# Patient Record
Sex: Female | Born: 1990
Health system: Western US, Academic
[De-identification: ages and names within clinical notes are randomized; demographics above are authoritative.]

---

## 2022-04-08 IMAGING — MR COLUNA LOMBAR
4 series · 31 of 48 positions shown · non-contrast
Comparison: none

[Series 3001: T2 · sagittal · 4.0mm · 0.72mm/px · 8 of 12 slices shown]
[im 1/12]
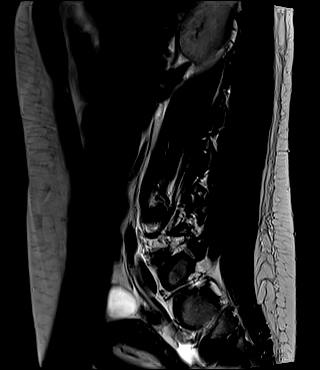
[im 2/12]
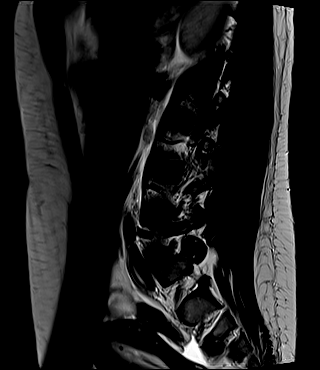
[im 4/12]
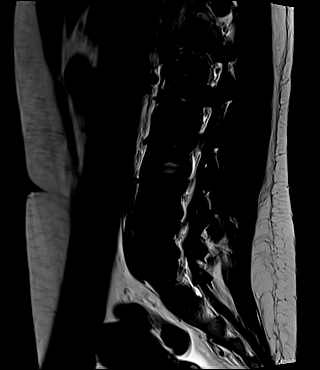
[im 5/12]
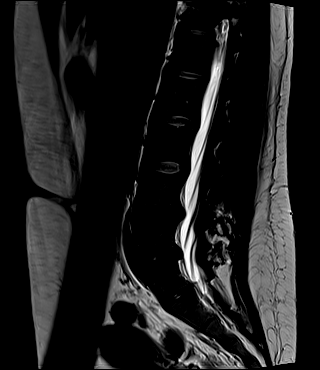
[im 7/12]
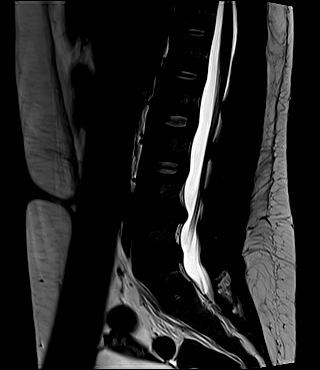
[im 8/12]
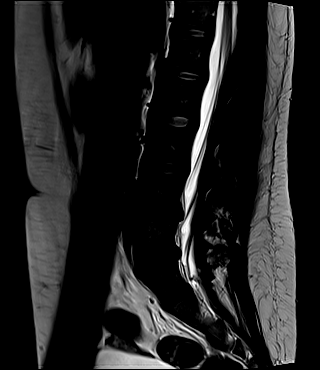
[im 10/12]
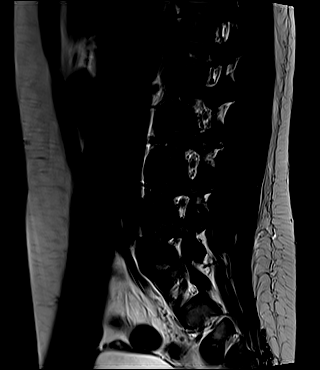
[im 12/12]
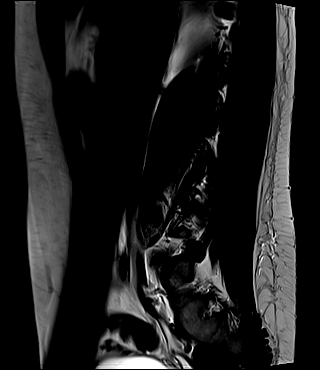

[Series 4001: T1 · sagittal · 4.0mm · 0.80mm/px · 9 of 12 slices shown]
[im 1/12]
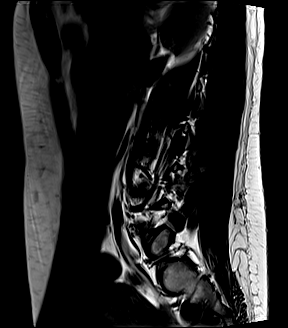
[im 2/12]
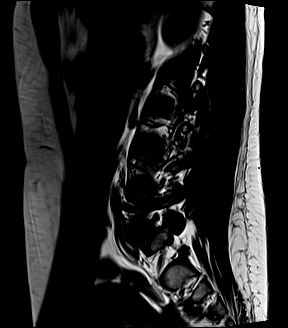
[im 3/12]
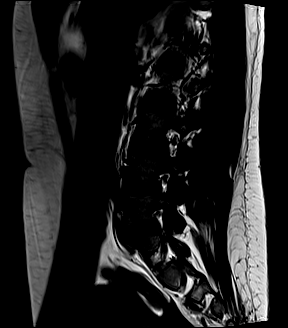
[im 5/12]
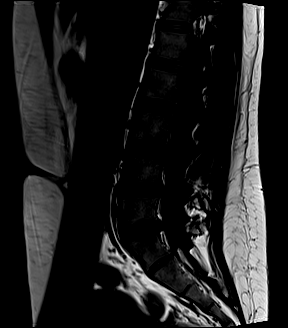
[im 6/12]
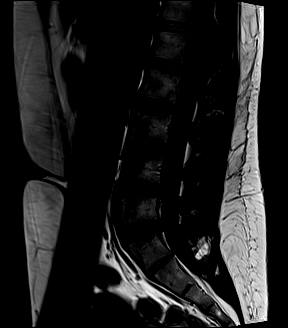
[im 7/12]
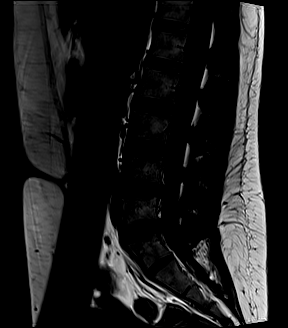
[im 9/12]
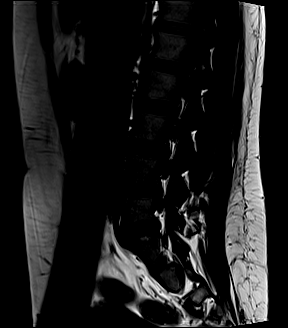
[im 10/12]
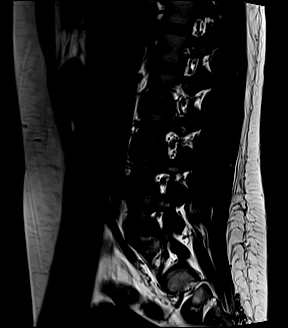
[im 12/12]
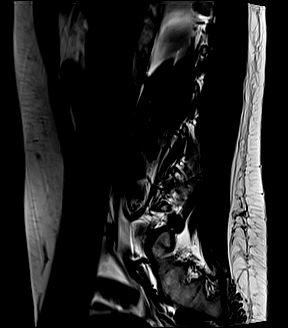

[Series 5001: sag_t2_qtse_stir · sagittal · 4.0mm · 0.55mm/px · 9 of 12 slices shown]
[im 1/12]
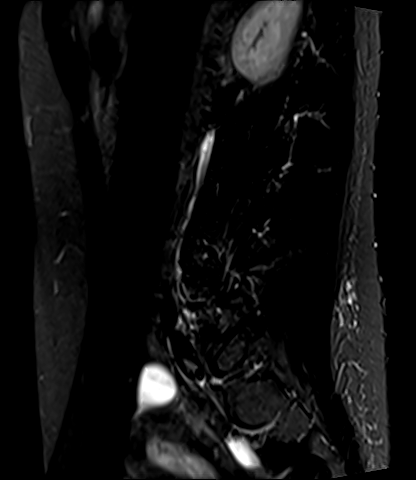
[im 2/12]
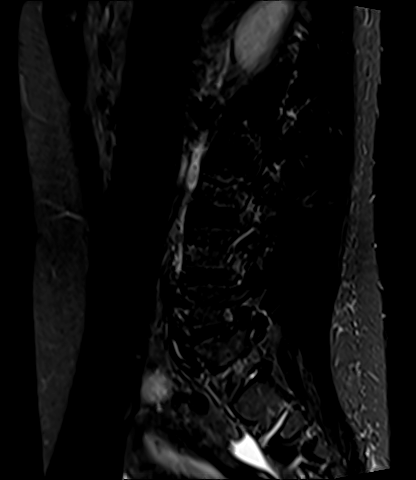
[im 3/12]
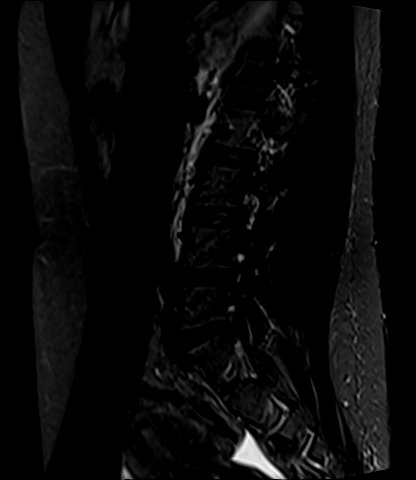
[im 5/12]
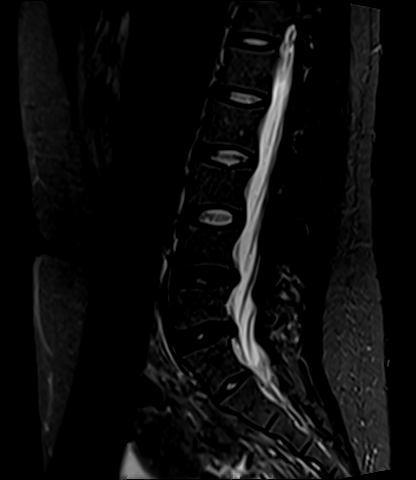
[im 6/12]
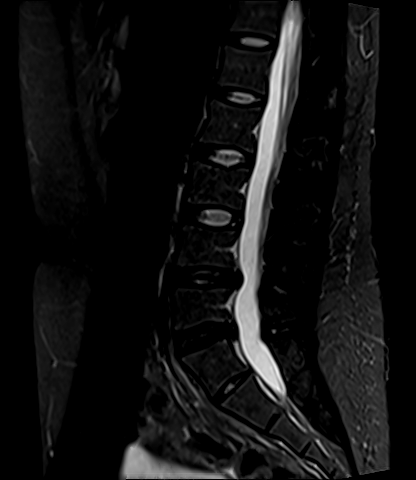
[im 7/12]
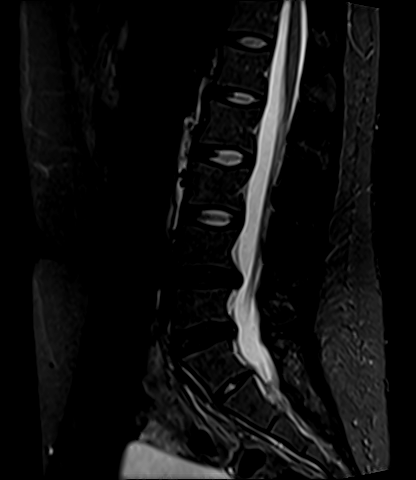
[im 9/12]
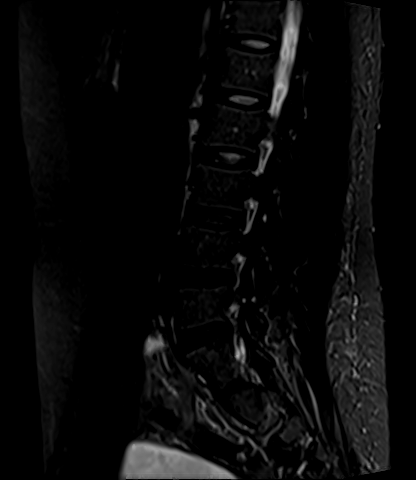
[im 10/12]
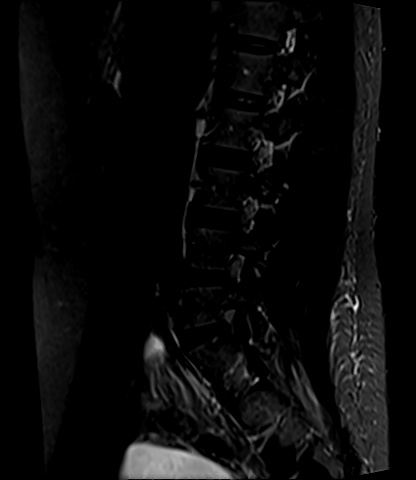
[im 12/12]
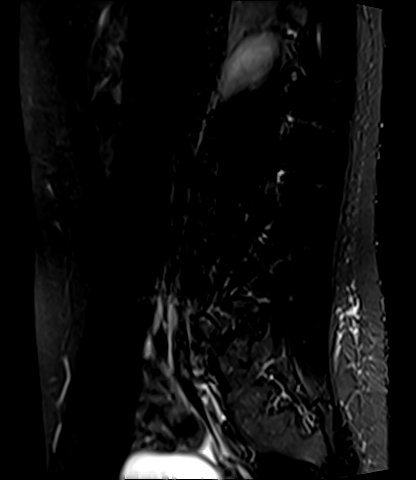

[Series 6001: axial_t2_qtse_384 · axial · 4.0mm · 0.43mm/px · z∈[-78,+92]mm · 5 of 25 slices shown]
[im 2/25]
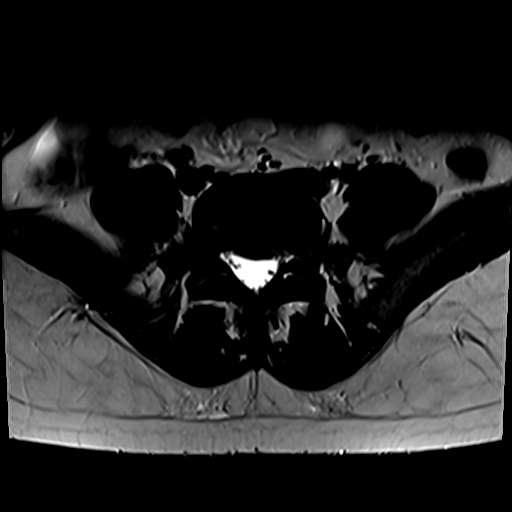
[im 4/25]
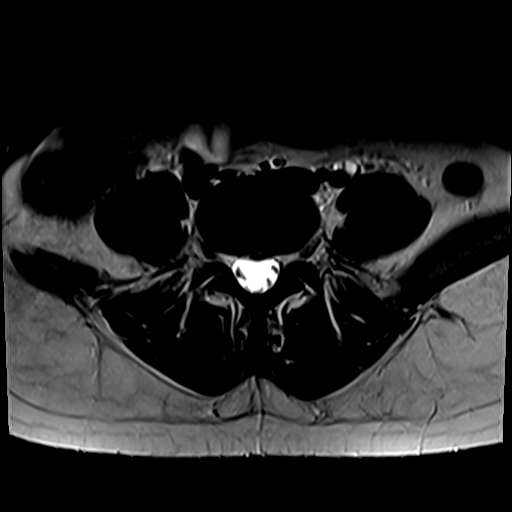
[im 8/25]
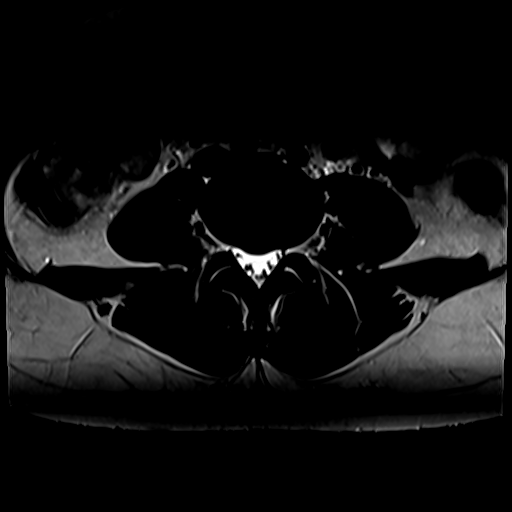
[im 13/25]
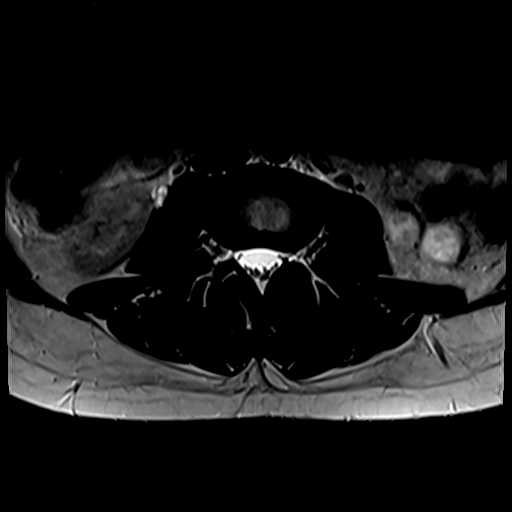
[im 21/25]
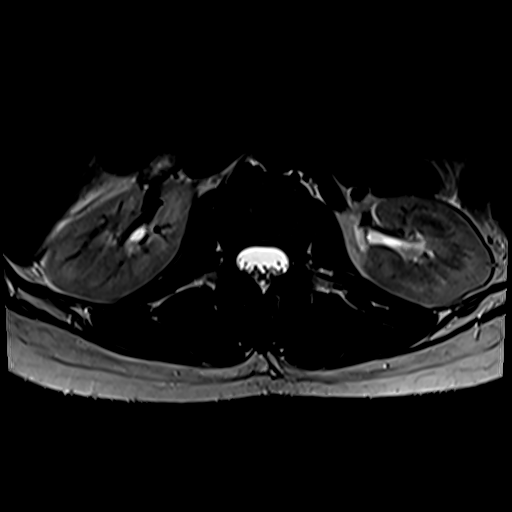

[31 of 48 positions shown; findings below may reference images not displayed]

Solicitante: WAHON AJAH

Técnica de exame:
Exame de ressonância magnética da coluna lombo-sacra realizado com as sequências FAST SPIN-ECHO,
incluindo ponderações T1 e T2 nos planos sagital e axial.
Indicação clínica: Lombociatalgia
RESSONÂNCIA MAGNÉTICA DA COLUNA LOMBOSSACRA
Os seguintes aspectos foram observados:
Estrutura óssea com sinal preservado.
Desidratação discal em L4-L5 e L5-S1.
Abaulamento discal difuso em L4-L5, com componente discal protruso póstero-central, comprimindo a face
ventral do saco dural e reduzindo a amplitude das bases foraminais.
Abaulamento discal difuso simétrico em L5-S1, comprimindo a face ventral do saco dural e reduzindo a
amplitude das bases foraminais.
Cone medular de configuração anatômica e homogeneidade de sinal.
Canal raquidiano com dimensões normais.
Articulações apofisárias preservadas.
Estruturas paravertebrais sem evidências de anormalidades.
Estruturas ligamentares sem anormalidades.
As raízes visibilizadas nos forames de conjugação são de aspecto normal.
Impressão Diagnóstica:
Alterações degenerativas da coluna lombar, cujas características foram acima descritas.
Solicitante: WAHON AJAH

## 2022-04-08 IMAGING — MR BACIA BACIA
4 of 7 series · 18 of 48 positions shown · non-contrast
Comparison: none

[Series 1001: loc · axial · B · 8.0mm · 0.78mm/px · z∈[-73,+152]mm · 4 of 15 slices shown]
[im 1/15]
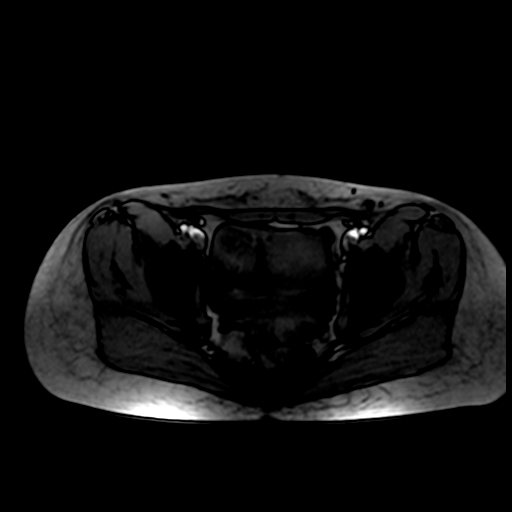
[im 5/15]
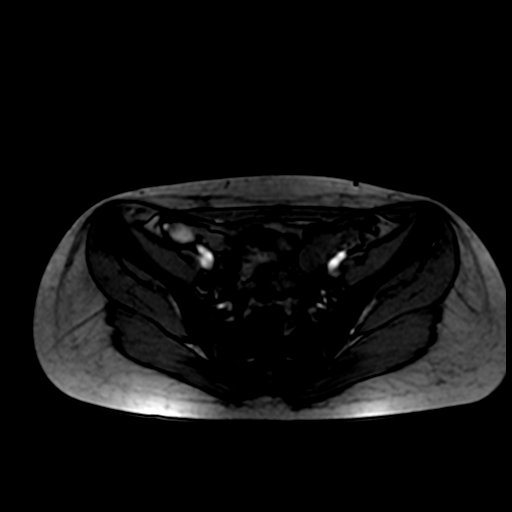
[im 10/15]
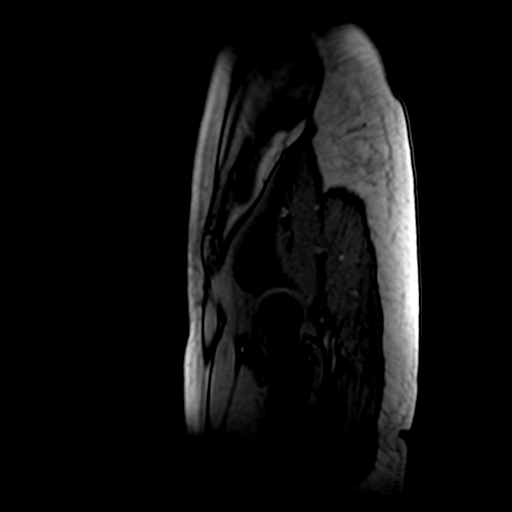
[im 15/15]
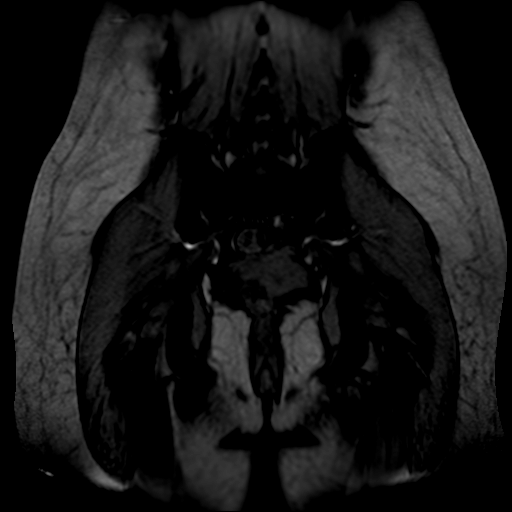

[Series 2001: t2_tse_stir_cor_p2 · coronal · B · 4.0mm · 1.41mm/px · 6 of 25 slices shown]
[im 1/25]
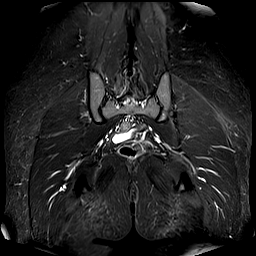
[im 5/25]
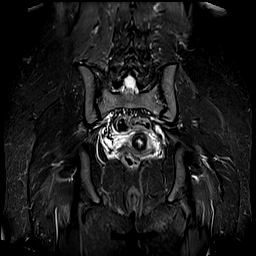
[im 10/25]
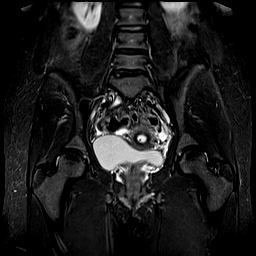
[im 15/25]
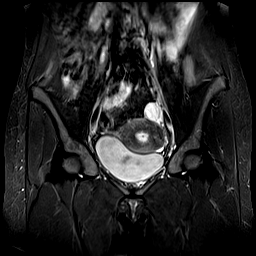
[im 20/25]
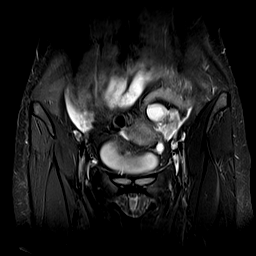
[im 25/25]
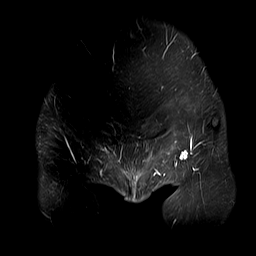

[Series 3001: T1 · coronal · B · 4.0mm · 0.43mm/px · 5 of 25 slices shown]
[im 1/25]
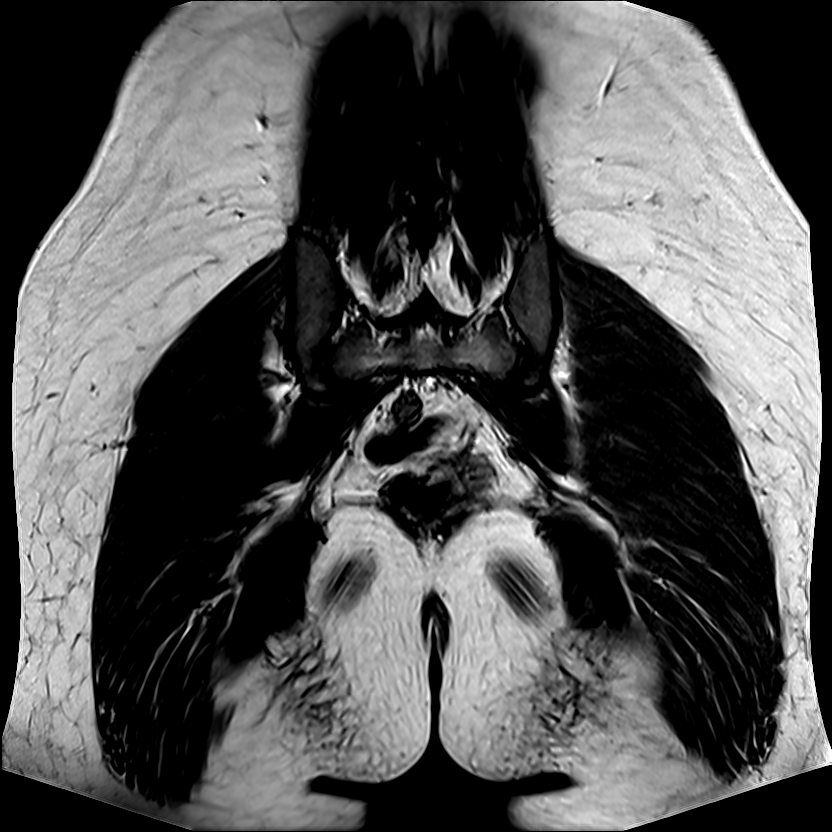
[im 5/25]
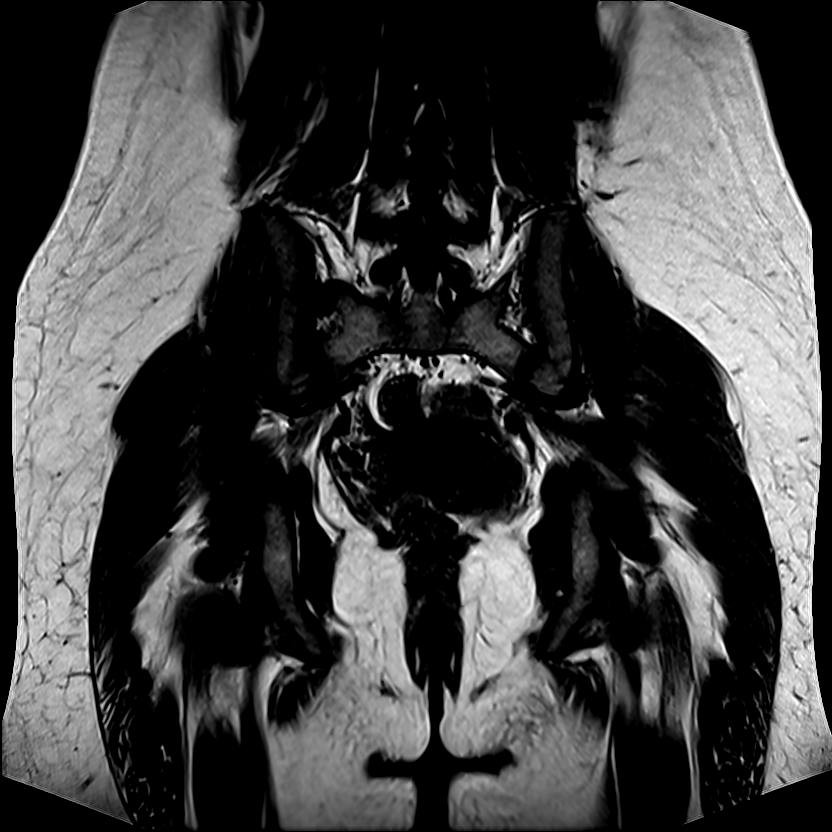
[im 10/25]
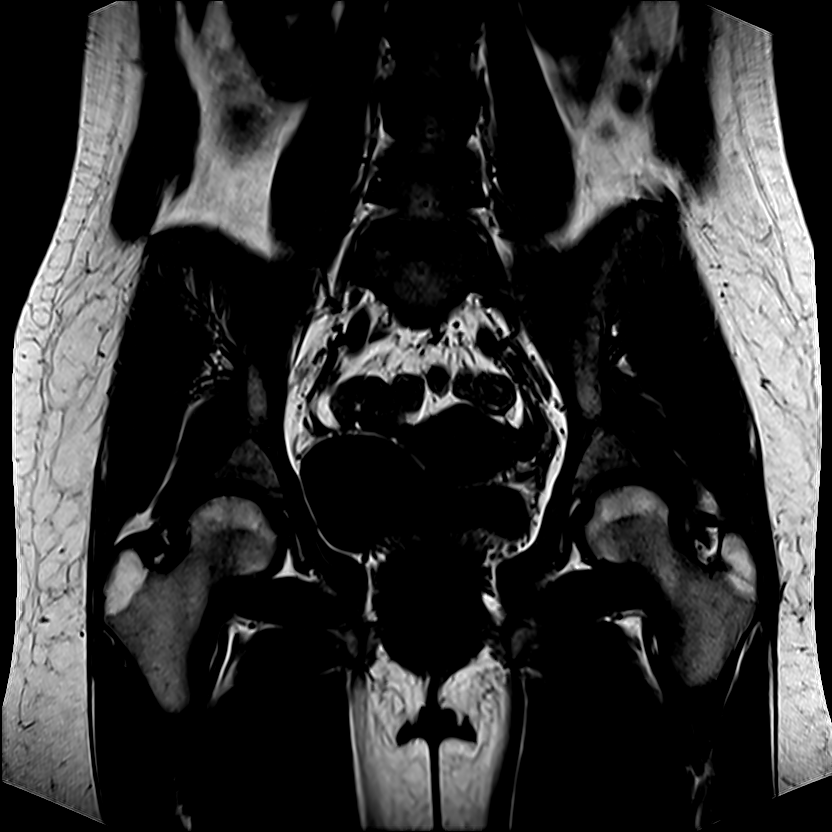
[im 15/25]
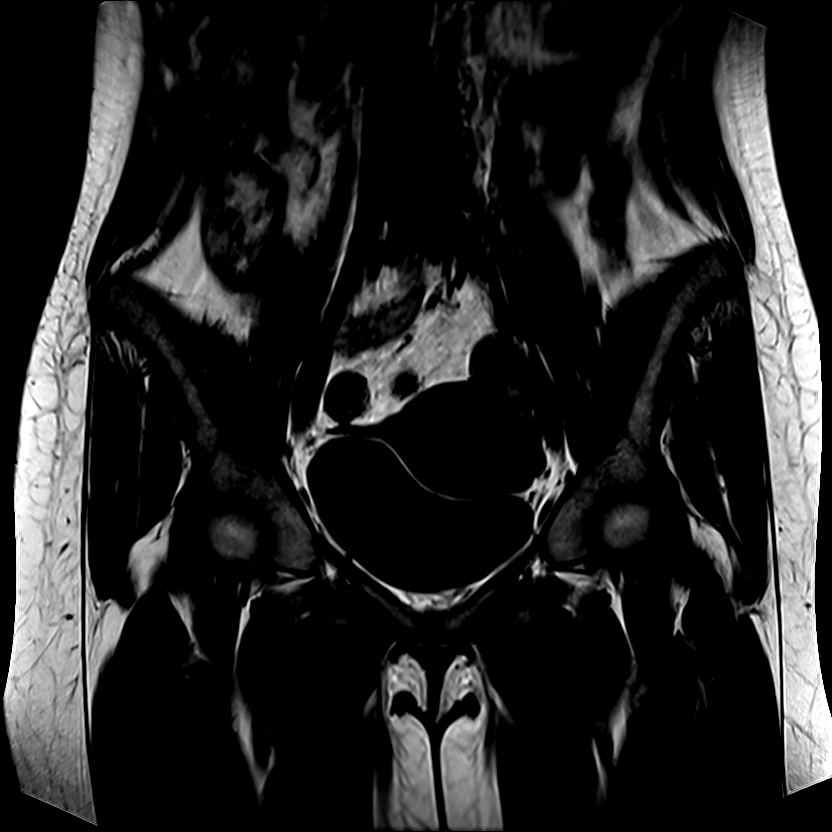
[im 25/25]
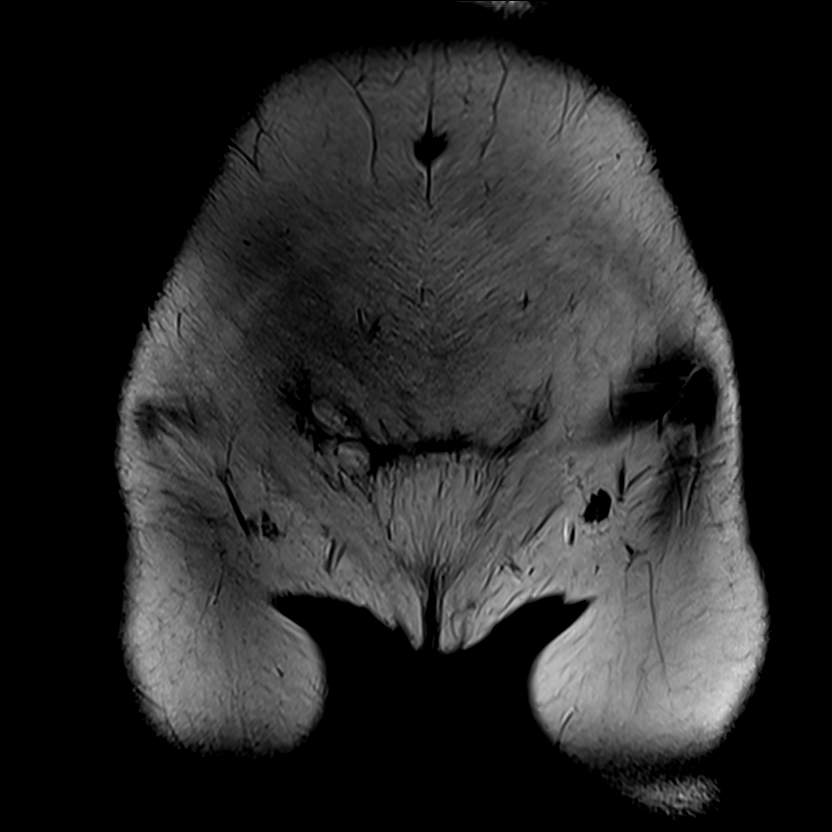

[Series 4001: t2_tse_stir_tra_p2 · axial · B · 4.0mm · 1.25mm/px · z∈[-131,+9]mm · 3 of 36 slices shown]
[im 6/36]
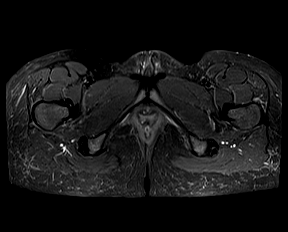
[im 21/36]
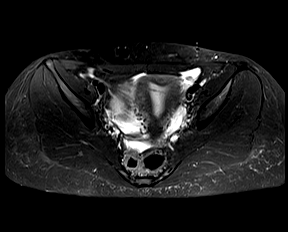
[im 31/36]
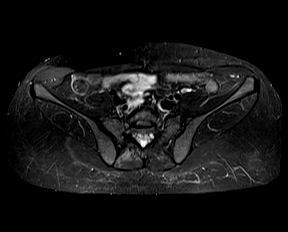

[18 of 48 positions shown; findings below may reference images not displayed]

Solicitante: HEIDI U [employer]ER[employer]CHBERGER
RESSONÂNCIA MAGNÉTICA DA BACIA
MÉTODO:
Foram obtidas seqüências ponderadas em T1 e T2 em aquisições multiplanares.
ANÁLISE:
Discreta artropatia degenerativa nos quadris caracterizada por afilamento condral discreto, predominando
nos compartimentos superiores.
Concavidade habitual do colo femoral.
Ausência de derrame articular significativo.
Tendinopatia justainsercional dos glúteos mínimos e da lâmina anterior do glúteo médio, bilateral, sem
roturas.
Discreta entesopatia na origem dos isquiotibiais.
Bursas trocanterianas e do iliopsoas preservadas.
Ventres musculares da cintura pélvica de trofismo conservado, sem lesões.
Ausência de lesões pélvicas.
IMPRESSÃO DIAGNÓSTICA:
Discreta condropatia coxofemoral bilateral.
Tendinopatia justainsercional dos glúteos mínimos e da lâmina anterior do glúteo médio.
Discreta entesopatia na origem dos isquiotibiais.
Demais achados descritos no corpo do laudo.

## 2022-11-11 ENCOUNTER — Telehealth: Payer: BLUE CROSS/BLUE SHIELD

## 2022-11-11 NOTE — Telephone Encounter
Appointment Accommodation Request      Appointment Type: New obgyn    Reason for sooner request: Pt has a 10cm cyst and she was previously told that she should have ovary removed. Pt was referred by family Judd Lien abida,  that is a pt of  Dr. Noralyn Pick to see doctor. Pt has stated she has been in pain and has taken a herbal tea which has decreased cyst size. Pt has been to urgent care in a lot of pain and some vaginal bleeding was told it has to do with cyst.      Date/Time Requested (If any):     Last seen by MD:     Any Symptoms:  []  Yes  [x]  No      If yes, what symptoms are you experiencing:   Duration of symptoms (how long):     Patient or caller was offered an appointment but declined.    Patient or caller was advised to seek emergency services if conditions are urgent or emergent.    Patient or caller has been notified of the turnaround time of 1-2 business (days).

## 2022-11-11 NOTE — Telephone Encounter
Scheduled 1st aval [facility] provider, pt confirmed appt details over the phone and was sent a link to sign up for mychart.

## 2022-12-07 ENCOUNTER — Ambulatory Visit: Payer: BLUE CROSS/BLUE SHIELD

## 2022-12-07 DIAGNOSIS — R102 Pelvic and perineal pain: Secondary | ICD-10-CM

## 2022-12-07 DIAGNOSIS — N946 Dysmenorrhea, unspecified: Secondary | ICD-10-CM

## 2022-12-07 DIAGNOSIS — N80129 Endometrioma of ovary: Secondary | ICD-10-CM

## 2022-12-07 NOTE — Progress Notes
PATIENT: Kathleen Garrison  MRN: [id number]  DOB: Jan 14, 1991  DATE OF SERVICE: 12/07/2022    REFERRING PRACTITIONER: No ref. provider found  PRIMARY CARE PROVIDER: No primary care provider on file.    CHIEF COMPLAINT:   Chief Complaint   Patient presents with    Ovarian Cyst       Subjective:       History of Present Illness:  Kathleen Garrison is a 32 y.o. G0P0000 female who presents for:    Endometriosis Consult    History: August 2023, diagnosed with this current cyst; was about 10cm at    Symptoms: pain, discomfort, fullness, painful periods    Lower back pain, inflammation, all of the time, but worsens with her period. Takes ibuprofen, but very limited doses.     Reports painful BMs and urination at times. Also has dyspareunia.    Future fertility desire: desires to have children in the future    Imaging:  10/24/2022 Pelvic Ultrasound:  ''1. Redemonstration of a left ovarian complex cystic lesion with low-level internal echoes measuring 8.6 cm, previously measuring 9.2 cm. Considerations include an endometrioma. ''    She has no other gynecologic concerns.  She is sexually active, in monogamous relationship    Menstrual History:  Does bleeding occur between periods?: Yes    Age at first period (years):: 15    Regular periods: Start every how many days?: 20    Irregular periods: Start every how many days?:     How long do periods last (in days):: 7/8 days    Does bleeding occur after intercourse?: Yes    Is pain associated with periods?: Yes       Pregnancy History:   OB History   Gravida Para Term Preterm AB Living   0 0 0 0 0 0   SAB IAB Ectopic Multiple Live Births   0 0 0 0 0          History reviewed. No pertinent past medical history.  History reviewed. No pertinent surgical history.  No outpatient medications prior to visit.     No facility-administered medications prior to visit.     Not on File  History reviewed. No pertinent family history.  Social History     Socioeconomic History    Marital status: Unknown   Tobacco Use    Smoking status: Never    Smokeless tobacco: Never   Substance and Sexual Activity    Alcohol use: Not Currently    Drug use: Never    Sexual activity: Yes     Partners: Male         Review of Systems:  All 14 point review of systems reviewed by me and negative other than what is listed in HPI       Objective:      Physical Exam:  BP 125/89  ~ Pulse 73  ~ Ht 5' 2'' (1.575 m)  ~ Wt 146 lb 6.4 oz (66.4 kg)  ~ LMP 11/26/2022  ~ BMI 26.78 kg/m?   General: WDWN female in NAD  HEENT: PERRLA, EOMI, NCAT, Neck supple, no thyromegaly, no palpable masses  Abdomen: +BS, soft, NTTP, ND, no hepatosplenomegaly, no palpable masses/hernias  Lymphatic: no head/neck, axillary, or inguinal LAD  Skin: no rashes/lesions  Neurologic/Psychiatric: A&O x 4, mood/affect appropriate  Pelvic: deferred      Assessment/Plan    Kathleen Garrison is a 32 y.o. female G0P0000 with:    1. Pelvic pain  We discussed all possible causes  of pelvic pain in great detail today, including GYN, GI, GU, MSK, and psychiatric. Given her history and today's exam, it appears that a good portion of her pain is likely due to endometrioma/endometriosis.    - MR pelvis wo+w contrast; Future    2. Endometrioma of ovary  # Endometriosis  - We discussed the management of endometriosis. Medical management can be effective and includes OCPs or progestin therapy (oral, Depo Provera, Mirena IUD). We discussed surgical management. There is some evidence that natural fertility may be modestly improved after surgery for mild and moderate endometriosis, however in her case with a large endometrioma, I do think moving forward with surgical management as planned make sense given size of endometrioma and symptoms, more than for fertility  - Discussed that endometriosis can impact fertility by a number of mechanisms  - Discussed that ovarian cystectomy can damage normal ovarian tissue and may decrease ovarian reserve to some degree  - No benefit to hormonal suppression prior to actively pursuing fertility once surgery completed  - Recommend follow-up with [facility] following surgery to pursue fertility treatment    - MR pelvis wo+w contrast; Future    3. Dysmenorrhea  Today we reviewed common causes of dysmenorrhea, including endometriosis, adenomyosis, infections, fibroids, etc. We also discussed common treatment strategies including COC's and progestin only contraception for ovulation suppression, NSAIDs, Acetaminophen, and other, less commonly prescribed medications for this indication. We also discussed the utility of diagnostic laparoscopy to evaluate for endometriosis or adenomyosis.     - MR pelvis wo+w contrast; Future     Final plan: pelvic MRI with endometriosis protocol, f/u with vv, and schedule for robotic endometrioma/endometriosis excision    Kathleen Garrison has had all questions answered satisfactorily and is in agreement with this recommended plan of care.     All sensitive exams were conducted with a female chaperone present.       A total of 35 minutes today were spent personally by me on this encounter, including face to face time with the patient obtaining a medically appropriate history, performing a medically appropriate exam / and or evaluation, and providing counseling and education to the patient/family/caregiver, as well as today's time spent on pre-visit review of the chart, reviewing prior in-system records, reviewing prior lab results, reviewing outside records, independent interpretation of prior results and communicating those results to the patient / family / caregiver, ordering appropriate labs, tests, and/or imaging studies, and referring to and/or communicating with other healthcare professionals and documenting the above in the medical record. The time documented is exclusive of the time spent on any separately billed procedure.           Author: Nolen Mu 12/07/2022 6:13 PM

## 2022-12-18 ENCOUNTER — Telehealth: Payer: BLUE CROSS/BLUE SHIELD

## 2022-12-18 NOTE — Telephone Encounter
S/W PT AND SHE STATED THAT SHE WOULD LIKE TO SCHEDULE FOR NOV.

## 2022-12-18 NOTE — Telephone Encounter
Message to Practice/Provider      Message: Pt called back to advise she changed her mind and would prefer to schedule in October for surgery.    Return call is not being requested by the patient or caller.    Patient or caller has been notified of the turnaround time of 1-2 business day(s).

## 2022-12-18 NOTE — Telephone Encounter
-----   Message from Nolen Mu, MD sent at 12/07/2022 11:49 AM PDT -----  SURGERY SCHEDULING REQUEST    ATTENTION: General Gynecology    GENERAL INFORMATION  DIAGNOSIS: left ovarian endometrioma, pelvic pain  PROCEDURE: Examination under anesthesia, left ovarian cystectomy, excision of endometriosis  CASE LENGTH: 240 minutes  ANESTHESIA: General endotracheal anesthesia (GETA)  LOCATION: [facility] or [facility]  CLASS: Outpatient    REQUESTED EQUIPMENT  [employer]    PREOPERATIVE REQUIREMENTS  None    ORDERED PREOPERATIVE TESTS  CBC  Type and crossmatch x 2 units PRBC  HCG    ASSIGNED BOWEL PREPARATION  None    POSTOPERATIVE PLAN  FOLLOW-UP: 2 weeks  RETURN TO WORK: 2 weeks    ADDITIONAL SPECIAL REQUESTS  None

## 2022-12-24 ENCOUNTER — Ambulatory Visit: Payer: BLUE CROSS/BLUE SHIELD

## 2022-12-24 ENCOUNTER — Inpatient Hospital Stay: Payer: BLUE CROSS/BLUE SHIELD

## 2022-12-24 DIAGNOSIS — N80129 Endometrioma of ovary: Secondary | ICD-10-CM

## 2022-12-24 DIAGNOSIS — R102 Pelvic and perineal pain: Secondary | ICD-10-CM

## 2022-12-26 ENCOUNTER — Inpatient Hospital Stay: Payer: BLUE CROSS/BLUE SHIELD

## 2022-12-28 NOTE — Telephone Encounter
BMI was reviewed prior to calling pt and confirmed pt is appropriate for [facility] venue    I called patient to introduce myself as part of their care team and my role in their care.    Spoke with patient to confirm receipt of order for surgery.   Confirmed case to be scheduled on: 7/22  Discussed when MD is currently scheduling and advised DOS may be week(s) of: NOV  Pt confirmed their availability/preference for 11/12. They confirmed they will not be available or have restrictions for the following dates:  N/A.    Pt confirmed PCP is: NO PCP and this has been updated in Care Team  Pt informed MD requested (labs/preop/PEPC/imaging/COVID test): CBC/HCG/T&C prior to surgery.    I advised the patient of our [facility] department which can provide additional clarification of the anesthesia process and that this is not an additional charge for the patient. They are aware that consultations can be arranged via phone and a more in-depth consult to provide an additional screening step prior to surgery which can be done in person or over video consult.     I confirmed that while their provider did not request this, it is a service we offer and the pt confirmed that they are NOT interested.    I advised pt that they would receive MyChart message with confirmation of DOS and instructions preceded by a call from my to schedule their postop appointment.    Pt was advised that their surgery time would not be officially confirmed until day before surgery and that the final confirmation for their arrival to surgical venue would come from the admissions department.    I advised pt that I am their main point of contact and they should send MyChart msg if they have non-urgent messages prior to surgery.   If the reason for the call is urgent, they know to call and ask to speak with me directly.     I reminded pt that if they are making any changes to their insurance prior to their procedure to please contact me ASAP and provide me with new insurance info ASAP in order to avoid a possible delay in their care.

## 2022-12-30 ENCOUNTER — Ambulatory Visit: Payer: BLUE CROSS/BLUE SHIELD

## 2022-12-30 ENCOUNTER — Telehealth: Payer: BLUE CROSS/BLUE SHIELD

## 2022-12-30 DIAGNOSIS — N80129 Endometrioma of ovary: Secondary | ICD-10-CM

## 2022-12-30 DIAGNOSIS — Z01818 Encounter for other preprocedural examination: Secondary | ICD-10-CM

## 2022-12-30 NOTE — Progress Notes
Patient Consent to Telehealth Questionnaire        No data to display              - I agree  to be treated via a video visit and acknowledge that I may be liable for any relevant copays or coinsurance depending on my insurance plan.  - I understand that this video visit is offered for my convenience and I am able to cancel and reschedule for an in-person appointment if I desire.  - I also acknowledge that sensitive medical information may be discussed during this video visit appointment and that it is my responsibility to locate myself in a location that ensures privacy to my own level of comfort.  - I also acknowledge that I should not be participating in a video visit in a way that could cause danger to myself or to those around me (such as driving or walking).  If my provider is concerned about my safety, I understand that they have the right to terminate the visit.     PATIENT: Kathleen Garrison  MRN: [id number]  DOB: 03/05/1991  DATE OF SERVICE: 12/30/2022    REFERRING PRACTITIONER: No ref. provider found  PRIMARY CARE PROVIDER: No primary care provider on file.    CHIEF COMPLAINT: Endometriosis follow up    Subjective:       History of Present Illness:  Kathleen Garrison is a 32 y.o. G0P0000 female who presents for follow up of her endometriosis    Yesterday had painful bowel movements and cramping    . Recently had pelvic MRI with endo protocol done on 12/28/2022 outside of  (at [facility]), report available via Care Everywhere. Here for results and next steps:    Patient MRN: [id number] Patient Name: Kathleen Garrison    MRI OF THE PELVIS WITH AND WITHOUT CONTRAST    CLINICAL HISTORY: Endometriosis.    COMPARISON: 10/24/2022, US PELVIS W TRANSVAGINAL    TECHNIQUE: Multiplanar multisequence imaging was performed through the pelvis using a gynecologic protocol before and after the administration of intravenous contrast using a 3T MRI scanner. Glucagon 1 mg IV was administered during examination to reduce bowel peristalsis.    CONTRAST ADMINISTERED: GADOBUTROL 1 MMOL/ML IV SOLN 7mL  _________________________________________________    FINDINGS:    Uterus:  Orientation: Anteflexed.  Morphology: Normal.  Size: 7.7 x 3.5 x 5.7 cm (longitudinal x AP x TR).  Junctional zone: Thickness 0.2 cm. Normal thickness with low T2 signal.  Endometrium: Normal thickness. No focal lesions.    Regions of endometriosis in the pelvis that are apparent on MRI include:    Anterior compartment  - Vesicouterine/vesicovaginal: No MRI evidence  - Bladder: No MRI evidence  - Ureter: No MRI evidence  - Round ligament: No MRI evidence    Middle compartment  - Right ovary: No MRI evidence  - Left ovary: Yes.  Left ovarian endometrioma measuring 10.8 x 9.2 cm with T1 hyperintensity and T2 shading.  No postcontrast enhancement.  - Right fallopian tube: No MRI evidence  - Left fallopian tube: No MRI evidence  - Parametrium: No MRI evidence    Posterior compartment  - Posterior uterine wall/ rectouterine: Yes.  Superficial endometriotic implants with foci of T1 hyperintensity and tethering of the right ovary (Series 3, Image 61) as well as the sigmoid colon (Series 3, Image 58).  No deep intraluminal invasion of the sigmoid colon.  - Rectocervical: No MRI evidence  - Rectovaginal septum: No MRI evidence  - Uterosacral ligament:  No MRI evidence  - Sigmoid colon and rectum: Yes, see above.    Other:  - Appendix: No MRI evidence  - Small bowel: No MRI evidence  - Cecum: No MRI evidence  - Abdominal wall: No MRI evidence    Other significant findings: None.    _________________________________________________    IMPRESSION:    1.  Large left ovarian endometrioma measuring up to 10.8 cm.    2.  Endometriotic implants at the posterior uterine body associated with adhesions and tethering of the right ovary and sigmoid colon.                Electronically signed by Manfred Shirts 12/29/2022 8:43 AM  Exam End: 12/28/22 17:51    Specimen Collected: 12/29/22 07:19 Last Resulted: 12/29/22 08:43   Received From: [facility]  Result Received: 12/30/22 16:01       From previous consult note:  ''Endometriosis Consult     History: August 2023, diagnosed with this current cyst; was about 10cm at     Symptoms: pain, discomfort, fullness, painful periods     Lower back pain, inflammation, all of the time, but worsens with her period. Takes ibuprofen, but very limited doses.      Reports painful BMs and urination at times. Also has dyspareunia.     Future fertility desire: desires to have children in the future     Imaging:  10/24/2022 Pelvic Ultrasound:  ''1. Redemonstration of a left ovarian complex cystic lesion with low-level internal echoes measuring 8.6 cm, previously measuring 9.2 cm. Considerations include an endometrioma. ''     She has no other gynecologic concerns.  She is sexually active, in monogamous relationship''    She has no other gynecologic concerns.      Review of Systems:  All 14 point review of systems reviewed by me and negative other than what is listed in HPI       Objective:      Physical Exam:  LMP 11/26/2022   General: WDWN female in NAD  HEENT: PERRLA, EOMI, NCAT  Repiratory: breathing unlabored  Skin: no rashes/lesions visible  Neurologic/Psychiatric: A&O x 4, mood/affect appropriate  Pelvic: deferred (video visit)      Assessment/Plan    Kathleen Garrison is a 32 y.o. female G0P0000 with:    1. Endometrioma of ovary  -Reviewed pelvic MRI report in detail with patient, overall reassuring, and does not require any change in surgical plan  -Scheduled for robotic assisted ovarian cystectomy, excision of endometriosis  -reviewed R/B/A of the planned procedure in detail  -Tissue extraction methods also discussed  -We discussed small possibility of conversion to laparotomy.  -Preoperative instructions, intraoperative goals, and postoperative expectations were also reviewed in great detail.  -Patient verbalized understanding and is amenable to plan. All questions answered. 2. Preoperative testing    - hCG, Total Beta, Quantitative; Future  - CBC; Future  - hCG, Total Beta, Quantitative  - CBC      Plan: proceed with robotic surgyer    Kathleen Garrison has had all questions answered satisfactorily and is in agreement with this recommended plan of care.     A total of 30 minutes today were spent personally by me on this encounter, including face to face time with the patient obtaining a medically appropriate history, performing a medically appropriate exam / and or evaluation, and providing counseling and education to the patient/family/caregiver, as well as today's time spent on pre-visit review of the chart, reviewing prior in-system records, reviewing  prior lab results, reviewing outside records, independent interpretation of prior results and communicating those results to the patient / family / caregiver, ordering appropriate labs, tests, and/or imaging studies, ordering and/or refilling medications, and referring to and/or communicating with other healthcare professionals and documenting the above in the medical record. The time documented is exclusive of the time spent on any separately billed procedure.            Author: Nolen Mu 12/30/2022 4:21 PM

## 2022-12-30 NOTE — Telephone Encounter
Please place requested lab orders so I may inform patient, as it is out of scope for admin staff to place orders per [place] guidelines.

## 2023-02-04 NOTE — Telephone Encounter
Call Back Request      Reason for call back: Pt would like to know if there has been any cancellations for surgery w/ [name]. She is currently schedule for surgery on 04/20/2023.    Any Symptoms:  []  Yes  [x]  No      If yes, what symptoms are you experiencing:    Duration of symptoms (how long):    Have you taken medication for symptoms (OTC or Rx):      If call was taken outside of clinic hours:    [] Patient or caller has been notified that this message was sent outside of normal clinic hours.     [] Patient or caller has been warm transferred to the physician's answering service. If applicable, patient or caller informed to please call us back if symptoms progress.  Patient or caller has been notified of the turnaround time of 1-2 business day(s).

## 2023-02-09 ENCOUNTER — Telehealth: Payer: BLUE CROSS/BLUE SHIELD

## 2023-02-09 NOTE — Telephone Encounter
Call Back Request      Reason for call back: pt following up regarding auth that has not recieved by her medical group for the surgery below      Any Symptoms:  []  Yes  [x]  No      If yes, what symptoms are you experiencing:    Duration of symptoms (how long):    Have you taken medication for symptoms (OTC or Rx):      If call was taken outside of clinic hours:    [] Patient or caller has been notified that this message was sent outside of normal clinic hours.     [] Patient or caller has been warm transferred to the physician's answering service. If applicable, patient or caller informed to please call us back if symptoms progress.  Patient or caller has been notified of the turnaround time of 1-2 business day(s).

## 2023-02-10 NOTE — Telephone Encounter
Patients call was returned and was made aware procedure authorization are usually requested 30 days before the procedure date.

## 2023-03-24 NOTE — Telephone Encounter
Spoke with pt notified her that per [facility], pts auth was voided due to auth needing to go through her medical group [facility]. Advised pt that she will need to contact [facility] and have them initiate a OTA for the procedure. Provided pt info for [facility] to contact Ashok Pall to initiate a OTA. Pt understood and stated she will contact her medical group to have them initiate OTA.

## 2023-03-26 ENCOUNTER — Telehealth: Payer: BLUE CROSS/BLUE SHIELD

## 2023-03-26 NOTE — Telephone Encounter
Call Back Request      Reason for call back: Pt requesting to send pre-op order / clearance to PCP due to Pt is schedule next Friday 10/25, Pt requesting to send via mychart    Please advise, Thank you.    Any Symptoms:  []  Yes  [x]  No      If yes, what symptoms are you experiencing:    Duration of symptoms (how long):    Have you taken medication for symptoms (OTC or Rx):      If call was taken outside of clinic hours:    [] Patient or caller has been notified that this message was sent outside of normal clinic hours.     [] Patient or caller has been warm transferred to the physician's answering service. If applicable, patient or caller informed to please call us back if symptoms progress.  Patient or caller has been notified of the turnaround time of 1-2 business day(s).

## 2023-03-29 ENCOUNTER — Ambulatory Visit: Payer: BLUE CROSS/BLUE SHIELD

## 2023-03-29 NOTE — Telephone Encounter
Spoke with pt to obtain an update regarding the OTA for pts procedure. Per pt has been in communication with her insurance in which the told her she needed a pre-op appointment with her pcp at [facility]. Pt has scheduled that appointment. Per pt once she has her pre-op appointment she will communicate with them regarding an OTA. Pt is aware we might have to cancel her surgery if OTA is not in place or if pt is not financially cleared.

## 2023-03-29 NOTE — Telephone Encounter
Forms sent to pt via mychart

## 2023-03-31 ENCOUNTER — Telehealth: Payer: BLUE CROSS/BLUE SHIELD

## 2023-03-31 NOTE — Telephone Encounter
Spoke with pt and provided her patient financial services phone number per pt will call to obtain pricing information.

## 2023-03-31 NOTE — Telephone Encounter
Call Back Request      Reason for call back: Patient stated she needs upcoming CPT Code for upcoming surgery scheduled on 11/12 with Dr Bufford Buttner. Per patient, her insurance has denied coverage and would need to know how much she is covered for.    Any Symptoms:  []  Yes  []  No      If yes, what symptoms are you experiencing:    Duration of symptoms (how long):    Have you taken medication for symptoms (OTC or Rx):      If call was taken outside of clinic hours:    [] Patient or caller has been notified that this message was sent outside of normal clinic hours.     [] Patient or caller has been warm transferred to the physician's answering service. If applicable, patient or caller informed to please call us back if symptoms progress.  Patient or caller has been notified of the turnaround time of 1-2 business day(s).

## 2023-03-31 NOTE — Telephone Encounter
Spoke with pt provided with CPT codes

## 2023-03-31 NOTE — Telephone Encounter
Call Back Request      Reason for call back: Pt would like to speak with Dr. Alfonse Ras surgery coordinator. Pt states that she is having a hard time getting insurance to cover the surgery and would like to know how much the surgery would cost if she would pay out of pocket. Please assist, thank you!    Any Symptoms:  []  Yes  [x]  No      If yes, what symptoms are you experiencing:    Duration of symptoms (how long):    Have you taken medication for symptoms (OTC or Rx):      If call was taken outside of clinic hours:    [] Patient or caller has been notified that this message was sent outside of normal clinic hours.     [] Patient or caller has been warm transferred to the physician's answering service. If applicable, patient or caller informed to please call us back if symptoms progress.  Patient or caller has been notified of the turnaround time of 1-2 business day(s).

## 2023-03-31 NOTE — Telephone Encounter
Call Back Request      Reason for call back: pt requesting to speak with karen, pt would like to inform Clydie Braun she was advised the surgery coordinator would have to send an email stating the upcoming surgery date and self pay estimate to get the actual quote.   E mail: [email or web address]  Any Symptoms:  []  Yes  [x]  No      If yes, what symptoms are you experiencing:    Duration of symptoms (how long):    Have you taken medication for symptoms (OTC or Rx):      If call was taken outside of clinic hours:    [] Patient or caller has been notified that this message was sent outside of normal clinic hours.     [] Patient or caller has been warm transferred to the physician's answering service. If applicable, patient or caller informed to please call us back if symptoms progress.  Patient or caller has been notified of the turnaround time of 1-2 business day(s).

## 2023-04-01 ENCOUNTER — Ambulatory Visit: Payer: BLUE CROSS/BLUE SHIELD

## 2023-04-02 ENCOUNTER — Ambulatory Visit: Payer: BLUE CROSS/BLUE SHIELD

## 2023-04-02 ENCOUNTER — Telehealth: Payer: BLUE CROSS/BLUE SHIELD

## 2023-04-02 NOTE — Telephone Encounter
Spoke with pt per pt stated that pcp needs to know exactly what tests pt needs under recommended tests. Advised pt that I will speak with [name] on Monday to see which tests are recommended and send the updated forms back to her. Advised that I have reached out to my supervisor regarding the emailing a quote request and should have an answer by Monday. Pt is aware.

## 2023-04-02 NOTE — Telephone Encounter
PDL Call to Clinic    Reason for Call: Patient at pcp for pre op Md wanted to clarify orders, patients printed orders too vague     Appointment Related?  []  Yes  [x]  No     If yes;  Date:  Time:    Call warm transferred to PDL: []  Yes  [x]  No    Call Received by Clinic Representative:    If call not answered/not accepted, call received by Patient Services Representative:

## 2023-04-05 ENCOUNTER — Ambulatory Visit: Payer: BLUE CROSS/BLUE SHIELD

## 2023-04-05 NOTE — Telephone Encounter
Updated pre-op forms have been faxed to patient's pcp office fax#[phone] phone# [phone]. Pt is aware of forms being faxed.

## 2023-04-05 NOTE — Telephone Encounter
Hi [name],     Just wanted to update you that pt is in the process of submitting an OTA for her upcoming surgery however it is uncertain if we will have it in place prior to surgery so there is a possibility that it may need to be rescheduled. Will keep you updated along the way.       Thank you,   Clydie Braun

## 2023-04-07 ENCOUNTER — Telehealth: Payer: BLUE CROSS/BLUE SHIELD

## 2023-04-07 NOTE — Telephone Encounter
Message to Practice/Provider      Message: Per Lyla Son / [facility], Prior Authorization needs to be submitted to [facility] and not [employer].     Patient or caller has been notified that their message will be reviewed within 1-2 business days.

## 2023-04-07 NOTE — Telephone Encounter
Spoke with pt she does not want to reschedule surgery at the moment, I advised pt that we will need to reschedule if OTA has not been received. Pt stated she is going to follow up with her pcp's office and figure out what's going on.

## 2023-04-07 NOTE — Telephone Encounter
Call Back Request      Reason for call back: Pt requesting update from Clydie Braun regarding Prior Auth that needs  be submitted to Medical Group urgently for pt to have surgery 11/12 please assist.     Any Symptoms:  []  Yes  [x]  No      If yes, what symptoms are you experiencing:    Duration of symptoms (how long):    Have you taken medication for symptoms (OTC or Rx):      If call was taken outside of clinic hours:    [] Patient or caller has been notified that this message was sent outside of normal clinic hours.     [] Patient or caller has been warm transferred to the physician's answering service. If applicable, patient or caller informed to please call us back if symptoms progress.  Patient or caller has been notified of the turnaround time of 1-2 business day(s).

## 2023-04-08 NOTE — Telephone Encounter
Called [facility] [phone] submitted auth along with clinicals. Indicated at the top of auth for [facility] to submit an OTA. Will follow up on decision. Spoke with pt she is aware that an auth and OTA request was sent over to her medical group.

## 2023-04-08 NOTE — Telephone Encounter
SPOKE WITH PT.

## 2023-04-09 ENCOUNTER — Telehealth: Payer: BLUE CROSS/BLUE SHIELD

## 2023-04-09 NOTE — Telephone Encounter
Called phone number [phone] spoke with Kathleen Garrison I advised her that pt is aware she is out of network and would still like to proceed with the authorization process. [name] noted into patients case.

## 2023-04-09 NOTE — Telephone Encounter
Call Back Request      Reason for call back: [name] is calling from the patient's medical insurance. She is looking to see if the patient's procedure can be transferred to a provider in the patient's medical group because [name] is outside of the group. If not, she would like to know if she should continue with the authorization process.     CBN: [phone]   Case number: [id number]    Any Symptoms:  []  Yes  [x]  No      If yes, what symptoms are you experiencing:    Duration of symptoms (how long):    Have you taken medication for symptoms (OTC or Rx):      If call was taken outside of clinic hours:    [] Patient or caller has been notified that this message was sent outside of normal clinic hours.     [] Patient or caller has been warm transferred to the physician's answering service. If applicable, patient or caller informed to please call us back if symptoms progress.  Patient or caller has been notified of the turnaround time of 1-2 business day(s).

## 2023-04-13 NOTE — Telephone Encounter
Hi [name],     Pt unfortunately will need to cancel surgery for now due to insurance issue. Currently looking for a replacement, in the event that is unsuccessful I will move your PM case to first start.       Thank you,   Clydie Braun

## 2023-04-13 NOTE — Telephone Encounter
Spoke with pt, unable to get an OTA prior to surgery will be cancelling surgery per pt will call once she obtains an [place] and OTA approval from her medical group.

## 2023-05-05 ENCOUNTER — Ambulatory Visit: Payer: BLUE CROSS/BLUE SHIELD

## 2023-05-28 NOTE — Telephone Encounter
PATIENT REFERRED TO IN NETWORK PROVIDER.     Kathleen Garrison     KD    04/13/23 11:10 AM  Note  Hi [name],      Pt unfortunately will need to cancel surgery for now due to insurance issue. Currently looking for a replacement, in the event that is unsuccessful I will move your PM case to first start.         Thank you,   [name]     KD    04/13/23 10:57 AM  Note  Spoke with pt, unable to get an OTA prior to surgery will be cancelling surgery per pt will call once she obtains an [place] and OTA approval from her medical group.           Kathleen Garrison     KD    04/09/23  2:42 PM  Note   Called phone number [phone] spoke with [name] I advised her that pt is aware she is out of network and would still like to proceed with the authorization process. [name] noted into patients case.              [facility]    04/09/23 12:25 PM  Coffin, Charlaine Dalton routed this conversation to [facility] Scheduling   [name]     [facility]    04/09/23 12:25 PM  Note  Call Back Request        Reason for call back: [name] is calling from the patient's medical insurance. She is looking to see if the patient's procedure can be transferred to a provider in the patient's medical group because [name] is outside of the group. If not, she would like to know if she should continue with the authorization process.      CBN: [phone]   Case number: [id number]
